# Patient Record
Sex: Female | Born: 1955 | Race: Black or African American | Hispanic: No | Marital: Single | Smoking: Current some day smoker
Health system: Southern US, Community
[De-identification: ages and names within clinical notes are randomized; demographics above are authoritative.]

## PROBLEM LIST (undated history)

## (undated) DIAGNOSIS — M1990 Unspecified osteoarthritis, unspecified site: Secondary | ICD-10-CM

## (undated) DIAGNOSIS — M109 Gout, unspecified: Secondary | ICD-10-CM

## (undated) DIAGNOSIS — E119 Type 2 diabetes mellitus without complications: Secondary | ICD-10-CM

---

## 2015-06-17 ENCOUNTER — Emergency Department: Payer: Medicaid Other

## 2015-06-17 ENCOUNTER — Emergency Department
Admission: EM | Admit: 2015-06-17 | Discharge: 2015-06-17 | Disposition: A | Discharge status: Home / Self Care | Payer: Medicaid Other | Attending: Emergency Medicine | Admitting: Emergency Medicine

## 2015-06-17 ENCOUNTER — Encounter: Payer: Self-pay | Admitting: Emergency Medicine

## 2015-06-17 DIAGNOSIS — Y9389 Activity, other specified: Secondary | ICD-10-CM | POA: Diagnosis not present

## 2015-06-17 DIAGNOSIS — S8001XA Contusion of right knee, initial encounter: Secondary | ICD-10-CM | POA: Diagnosis not present

## 2015-06-17 DIAGNOSIS — Y92003 Bedroom of unspecified non-institutional (private) residence as the place of occurrence of the external cause: Secondary | ICD-10-CM | POA: Insufficient documentation

## 2015-06-17 DIAGNOSIS — W06XXXA Fall from bed, initial encounter: Secondary | ICD-10-CM | POA: Diagnosis not present

## 2015-06-17 DIAGNOSIS — Z79899 Other long term (current) drug therapy: Secondary | ICD-10-CM | POA: Diagnosis not present

## 2015-06-17 DIAGNOSIS — Z720 Tobacco use: Secondary | ICD-10-CM | POA: Insufficient documentation

## 2015-06-17 DIAGNOSIS — E119 Type 2 diabetes mellitus without complications: Secondary | ICD-10-CM | POA: Diagnosis not present

## 2015-06-17 DIAGNOSIS — Y998 Other external cause status: Secondary | ICD-10-CM | POA: Insufficient documentation

## 2015-06-17 DIAGNOSIS — S8991XA Unspecified injury of right lower leg, initial encounter: Secondary | ICD-10-CM | POA: Diagnosis present

## 2015-06-17 HISTORY — DX: Unspecified osteoarthritis, unspecified site: M19.90

## 2015-06-17 HISTORY — DX: Type 2 diabetes mellitus without complications: E11.9

## 2015-06-17 HISTORY — DX: Gout, unspecified: M10.9

## 2015-06-17 MED ORDER — HYDROCODONE-ACETAMINOPHEN 5-325 MG PO TABS: 1.0000 | ORAL_TABLET | ORAL | Status: DC | PRN

## 2015-06-17 MED ORDER — METHOCARBAMOL 500 MG PO TABS: 500.0000 mg | ORAL_TABLET | Freq: Four times a day (QID) | ORAL | Status: DC | PRN

## 2015-06-17 MED ORDER — KETOROLAC TROMETHAMINE 60 MG/2ML IM SOLN
60.0000 mg | Freq: Once | INTRAMUSCULAR | Status: AC
  Administered 2015-06-17: 60 mg via INTRAMUSCULAR
  Filled 2015-06-17: qty 2

## 2015-06-17 MED ORDER — IBUPROFEN 800 MG PO TABS: 800.0000 mg | ORAL_TABLET | Freq: Three times a day (TID) | ORAL | Status: DC | PRN

## 2015-06-17 NOTE — ED Notes (Signed)
Upon attempting to D/C pt, pt refused knee immobilizer. Pt ambulatory around flex at this time with no difficulty.

## 2015-06-17 NOTE — ED Notes (Signed)
Pt fell getting out of bed last night. Here for right knee pain.  Reports hx of arthritis and gout.

## 2015-06-17 NOTE — ED Provider Notes (Signed)
[facility] Emergency Department Provider Note  ____________________________________________  Time seen: Approximately 11:01 AM  I have reviewed the triage vital signs and the nursing notes.   HISTORY  Chief Complaint Knee Pain and Fall    HPI Whitney Gallagher is a 59 y.o. female patient complains of right pain pain since last night. Patient states she fell out of bed last night landing on her right knee.   Past Medical History  Diagnosis Date  . Arthritis   . Diabetes mellitus without complication   . Gout     There are no active problems to display for this patient.   No past surgical history on file.  Current Outpatient Rx  Name  Route  Sig  Dispense  Refill  . METFORMIN HCL PO   Oral   Take by mouth.         Marland Kitchen HYDROcodone-acetaminophen (NORCO) 5-325 MG per tablet   Oral   Take 1-2 tablets by mouth every 4 (four) hours as needed for moderate pain.   15 tablet   0   . ibuprofen (ADVIL,MOTRIN) 800 MG tablet   Oral   Take 1 tablet (800 mg total) by mouth every 8 (eight) hours as needed.   30 tablet   0   . methocarbamol (ROBAXIN) 500 MG tablet   Oral   Take 1 tablet (500 mg total) by mouth every 6 (six) hours as needed for muscle spasms.   30 tablet   0     Allergies Review of patient's allergies indicates no known allergies.  No family history on file.  Social History Social History  Substance Use Topics  . Smoking status: Current Some Day Smoker  . Smokeless tobacco: None  . Alcohol Use: None    Review of Systems Constitutional: No fever/chills Eyes: No visual changes. ENT: No sore throat. Cardiovascular: Denies chest pain. Respiratory: Denies shortness of breath. Gastrointestinal: No abdominal pain.  No nausea, no vomiting.  No diarrhea.  No constipation. Genitourinary: Negative for dysuria. Musculoskeletal: Positive for right knee pain. Skin: Negative for rash. Neurological: Negative for headaches, focal  weakness or numbness.  10-point ROS otherwise negative.  ____________________________________________   PHYSICAL EXAM:  VITAL SIGNS: ED Triage Vitals  Enc Vitals Group     BP 06/17/15 1045 149/63 mmHg     Pulse Rate 06/17/15 1045 85     Resp 06/17/15 1045 20     Temp 06/17/15 1045 98.5 F (36.9 C)     Temp Source 06/17/15 1045 Oral     SpO2 06/17/15 1045 96 %     Weight 06/17/15 1045 175 lb (79.379 kg)     Height 06/17/15 1045 5\' 2"  (1.575 m)     Head Cir --      Peak Flow --      Pain Score 06/17/15 1047 10     Pain Loc --      Pain Edu? --      Excl. in GC? --     Constitutional: Alert and oriented. Well appearing and in no acute distress. Eyes: Conjunctivae are normal. PERRL. EOMI. Head: Atraumatic. Nose: No congestion/rhinnorhea. Mouth/Throat: Mucous membranes are moist.  Oropharynx non-erythematous. Neck: No stridor.   Cardiovascular: Normal rate, regular rhythm. Grossly normal heart sounds.  Good peripheral circulation. Respiratory: Normal respiratory effort.  No retractions. Lungs CTAB. Gastrointestinal: Soft and nontender. No distention. No abdominal bruits. No CVA tenderness. Musculoskeletal: No lower extremity tenderness nor edema.  No joint effusions. Positive range of motion.  No ecchymosis or bruising noted. Increased pain with flexion Neurologic:  Normal speech and language. No gross focal neurologic deficits are appreciated. No gait instability. Skin:  Skin is warm, dry and intact. No rash noted. Psychiatric: Mood and affect are normal. Speech and behavior are normal.  ____________________________________________   LABS (all labs ordered are listed, but only abnormal results are displayed)  Labs Reviewed - No data to display ____________________________________________  RADIOLOGY  Negative for fracture or effusion. ____________________________________________   PROCEDURES  Procedure(s) performed: None  Critical Care performed:  No  ____________________________________________   INITIAL IMPRESSION / ASSESSMENT AND PLAN / ED COURSE  Pertinent labs & imaging results that were available during my care of the patient were reviewed by me and considered in my medical decision making (see chart for details).  Acute right knee contusion. Rx given for Motrin 800 mg 3 times a day, Robaxin 500 mg 3 times a day. And hydrocodone. Patient follow-up with PCP or return to the ER with any worsening symptomology.  Patient voices no other emergency medical complaints at this time. ____________________________________________   FINAL CLINICAL IMPRESSION(S) / ED DIAGNOSES  Final diagnoses:  Knee contusion, right, initial encounter      Evangeline Dakin, PA-C 06/17/15 1214  Jennye Moccasin, MD 06/17/15 [phone]

## 2015-06-17 NOTE — Discharge Instructions (Signed)
Contusion °A contusion is a deep bruise. Contusions are the result of an injury that caused bleeding under the skin. The contusion may turn blue, purple, or yellow. Minor injuries will give you a painless contusion, but more severe contusions may stay painful and swollen for a few weeks.  °CAUSES  °A contusion is usually caused by a blow, trauma, or direct force to an area of the body. °SYMPTOMS  °· Swelling and redness of the injured area. °· Bruising of the injured area. °· Tenderness and soreness of the injured area. °· Pain. °DIAGNOSIS  °The diagnosis can be made by taking a history and physical exam. An X-ray, CT scan, or MRI may be needed to determine if there were any associated injuries, such as fractures. °TREATMENT  °Specific treatment will depend on what area of the body was injured. In general, the best treatment for a contusion is resting, icing, elevating, and applying cold compresses to the injured area. Over-the-counter medicines may also be recommended for pain control. Ask your caregiver what the best treatment is for your contusion. °HOME CARE INSTRUCTIONS  °· Put ice on the injured area. °¨ Put ice in a plastic bag. °¨ Place a towel between your skin and the bag. °¨ Leave the ice on for 15-20 minutes, 3-4 times a day, or as directed by your health care provider. °· Only take over-the-counter or prescription medicines for pain, discomfort, or fever as directed by your caregiver. Your caregiver may recommend avoiding anti-inflammatory medicines (aspirin, ibuprofen, and naproxen) for 48 hours because these medicines may increase bruising. °· Rest the injured area. °· If possible, elevate the injured area to reduce swelling. °SEEK IMMEDIATE MEDICAL CARE IF:  °· You have increased bruising or swelling. °· You have pain that is getting worse. °· Your swelling or pain is not relieved with medicines. °MAKE SURE YOU:  °· Understand these instructions. °· Will watch your condition. °· Will get help right  away if you are not doing well or get worse. °Document Released: 07/06/2005 Document Revised: 10/01/2013 Document Reviewed: 08/01/2011 °Exit[employer]® Patient Information ©2015 [employer]. This information is not intended to replace advice given to you by your health care provider. Make sure you discuss any questions you have with your health care provider. ° °

## 2015-06-17 NOTE — ED Notes (Signed)
NAD noted at time of D/C. Pt ambulatory to the lobby at this time. Pt denies comments/concerns. Extensively explained medications to patient, pt denied further questions at this time. P

## 2016-01-24 ENCOUNTER — Emergency Department: Payer: Medicaid Other

## 2016-01-24 ENCOUNTER — Encounter: Payer: Self-pay | Admitting: Emergency Medicine

## 2016-01-24 ENCOUNTER — Emergency Department
Admission: EM | Admit: 2016-01-24 | Discharge: 2016-01-24 | Disposition: A | Discharge status: Home / Self Care | Payer: Medicaid Other | Attending: Emergency Medicine | Admitting: Emergency Medicine

## 2016-01-24 DIAGNOSIS — F17200 Nicotine dependence, unspecified, uncomplicated: Secondary | ICD-10-CM | POA: Diagnosis not present

## 2016-01-24 DIAGNOSIS — Y929 Unspecified place or not applicable: Secondary | ICD-10-CM | POA: Diagnosis not present

## 2016-01-24 DIAGNOSIS — Z79899 Other long term (current) drug therapy: Secondary | ICD-10-CM | POA: Insufficient documentation

## 2016-01-24 DIAGNOSIS — Z791 Long term (current) use of non-steroidal anti-inflammatories (NSAID): Secondary | ICD-10-CM | POA: Insufficient documentation

## 2016-01-24 DIAGNOSIS — S8391XA Sprain of unspecified site of right knee, initial encounter: Secondary | ICD-10-CM | POA: Insufficient documentation

## 2016-01-24 DIAGNOSIS — W109XXA Fall (on) (from) unspecified stairs and steps, initial encounter: Secondary | ICD-10-CM | POA: Diagnosis not present

## 2016-01-24 DIAGNOSIS — M1711 Unilateral primary osteoarthritis, right knee: Secondary | ICD-10-CM | POA: Diagnosis not present

## 2016-01-24 DIAGNOSIS — Y939 Activity, unspecified: Secondary | ICD-10-CM | POA: Diagnosis not present

## 2016-01-24 DIAGNOSIS — M25561 Pain in right knee: Secondary | ICD-10-CM | POA: Diagnosis present

## 2016-01-24 DIAGNOSIS — E119 Type 2 diabetes mellitus without complications: Secondary | ICD-10-CM | POA: Diagnosis not present

## 2016-01-24 DIAGNOSIS — Z7984 Long term (current) use of oral hypoglycemic drugs: Secondary | ICD-10-CM | POA: Diagnosis not present

## 2016-01-24 DIAGNOSIS — Y999 Unspecified external cause status: Secondary | ICD-10-CM | POA: Diagnosis not present

## 2016-01-24 MED ORDER — IBUPROFEN 800 MG PO TABS
800.0000 mg | ORAL_TABLET | Freq: Once | ORAL | Status: AC
  Administered 2016-01-24: 800 mg via ORAL
  Filled 2016-01-24: qty 1

## 2016-01-24 MED ORDER — OXYCODONE-ACETAMINOPHEN 5-325 MG PO TABS
1.0000 | ORAL_TABLET | Freq: Once | ORAL | Status: AC
  Administered 2016-01-24: 1 via ORAL
  Filled 2016-01-24: qty 1

## 2016-01-24 MED ORDER — OXYCODONE-ACETAMINOPHEN 5-325 MG PO TABS: 1.0000 | ORAL_TABLET | Freq: Four times a day (QID) | ORAL | Status: DC | PRN

## 2016-01-24 MED ORDER — IBUPROFEN 800 MG PO TABS: 800.0000 mg | ORAL_TABLET | Freq: Three times a day (TID) | ORAL | Status: DC | PRN

## 2016-01-24 NOTE — ED Notes (Signed)
States fell last night, slipped off of steps.  Slipped down three steps and landed on right knee. C/O right knee pain and swelling.

## 2016-01-24 NOTE — ED Provider Notes (Signed)
[facility] Emergency Department Provider Note  ____________________________________________  Time seen: Approximately 10:15 AM  I have reviewed the triage vital signs and the nursing notes.   HISTORY  Chief Complaint Knee Injury    HPI Whitney Gallagher is a 60 y.o. female with history of diabetes, gout and arthritis of the knees who fell onto the right knee last night. She has been able to walk, but has significant pain. Has difficulty bending the knee. She is able to fully extend the knee. She hasn't some swelling.She denies any foot or ankle pain. No hip pain.   Past Medical History  Diagnosis Date  . Arthritis   . Diabetes mellitus without complication (HCC)   . Gout     There are no active problems to display for this patient.   History reviewed. No pertinent past surgical history.  Current Outpatient Rx  Name  Route  Sig  Dispense  Refill  . HYDROcodone-acetaminophen (NORCO) 5-325 MG per tablet   Oral   Take 1-2 tablets by mouth every 4 (four) hours as needed for moderate pain.   15 tablet   0   . ibuprofen (ADVIL,MOTRIN) 800 MG tablet   Oral   Take 1 tablet (800 mg total) by mouth every 8 (eight) hours as needed.   30 tablet   0   . ibuprofen (ADVIL,MOTRIN) 800 MG tablet   Oral   Take 1 tablet (800 mg total) by mouth every 8 (eight) hours as needed.   15 tablet   0   . METFORMIN HCL PO   Oral   Take by mouth.         . methocarbamol (ROBAXIN) 500 MG tablet   Oral   Take 1 tablet (500 mg total) by mouth every 6 (six) hours as needed for muscle spasms.   30 tablet   0   . oxyCODONE-acetaminophen (ROXICET) 5-325 MG tablet   Oral   Take 1 tablet by mouth every 6 (six) hours as needed.   20 tablet   0     Allergies Review of patient's allergies indicates no known allergies.  No family history on file.  Social History Social History  Substance Use Topics  . Smoking status: Current Some Day Smoker  . Smokeless  tobacco: Never Used  . 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 Respiratory: Denies shortness of breath. Gastrointestinal: No abdominal pain.  No nausea, no vomiting.   Musculoskeletal: Per history of present illness. Skin: Negative for rash. Neurological: Negative for  focal weakness or numbness. 10-point ROS otherwise negative.  ____________________________________________   PHYSICAL EXAM:  VITAL SIGNS: ED Triage Vitals  Enc Vitals Group     BP 01/24/16 0935 126/69 mmHg     Pulse Rate 01/24/16 0935 113     Resp 01/24/16 0935 18     Temp 01/24/16 0935 97.5 F (36.4 C)     Temp Source 01/24/16 0935 Oral     SpO2 01/24/16 0935 96 %     Weight 01/24/16 0935 238 lb (107.956 kg)     Height 01/24/16 0935  (1.575 m)     Head Cir --      Peak Flow --      Pain Score 01/24/16 0934 10     Pain Loc --      Pain Edu? --  Excl. in GC? --     Constitutional: Alert and oriented. Well appearing and in no acute distress. Eyes: Conjunctivae are normal. Mouth/Throat: Mucous membranes are moist.   Neck:  Supple.    Cardiovascular: Normal rate, regular rhythm. Grossly normal heart sounds.  Good peripheral circulation. Respiratory: Normal respiratory effort.  No retractions. Lungs CTAB. Musculoskeletal: Right knee. Generalized tenderness, over patella and joint lines. Also tender in the answering bursa. Possible small effusion noted. Neurologic:  Normal speech and language. No gross focal neurologic deficits are appreciated. No gait instability. Skin:  Skin is warm, dry and intact. No rash noted. Psychiatric: Mood and affect are normal. Speech and behavior are normal.  ____________________________________________   LABS (all labs ordered are listed, but only abnormal results are displayed)  Labs Reviewed - No data to  display ____________________________________________  EKG   ____________________________________________  RADIOLOGY  CLINICAL DATA: 60 year old female with pain in the right knee during weight-bearing since a fall yesterday evening.  EXAM: RIGHT KNEE - COMPLETE 4+ VIEW  COMPARISON: Right knee radiograph 06/17/2015.  FINDINGS: No acute fracture, subluxation or dislocation noted. Joint space narrowing, subchondral sclerosis, subchondral cyst formation and osteophyte formation in a tricompartmental distribution, most severe in the medial compartment. Multiple radiopaque foreign bodies again noted in the soft tissues of the medial right thigh.  IMPRESSION: 1. No acute radiographic abnormality of the right knee. 2. Moderate to severe tricompartmental osteoarthritis, most severe in the medial compartment.   Electronically Signed  By: Trudie Reedaniel Entrikin M.D.  On: 01/24/2016 10:26  ____________________________________________   PROCEDURES  Procedure(s) performed: None  Critical Care performed: No  ____________________________________________   INITIAL IMPRESSION / ASSESSMENT AND PLAN / ED COURSE  Pertinent labs & imaging results that were available during my care of the patient were reviewed by me and considered in my medical decision making (see chart for details).  60 year old who fell onto her right knee last night. Stable x-rays though has arthritis changes. Suspect a strain/sprain with contusion. Encouraged ice, ibuprofen. Given Ace wrap and crutches. [place] and will follow up with primary care or orthopedist next week. ____________________________________________   FINAL CLINICAL IMPRESSION(S) / ED DIAGNOSES  Final diagnoses:  Knee sprain and strain, right, initial encounter      Ignacia BayleyRobert Celestine Bougie, PA-C 01/24/16 1043  Jene Everyobert Kinner, MD 01/24/16 1549

## 2016-01-24 NOTE — Discharge Instructions (Signed)
Knee Sprain A knee sprain is a tear in one of the strong, fibrous tissues that connect the bones (ligaments) in your knee. The severity of the sprain depends on how much of the ligament is torn. The tear can be either partial or complete. CAUSES  Often, sprains are a result of a fall or injury. The force of the impact causes the fibers of your ligament to stretch too much. This excess tension causes the fibers of your ligament to tear. SIGNS AND SYMPTOMS  You may have some loss of motion in your knee. Other symptoms include:  Bruising.  Pain in the knee area.  Tenderness of the knee to the touch.  Swelling. DIAGNOSIS  To diagnose a knee sprain, your health care provider will physically examine your knee. Your health care provider may also suggest an X-ray exam of your knee to make sure no bones are broken. TREATMENT  If your ligament is only partially torn, treatment usually involves keeping the knee in a fixed position (immobilization) or bracing your knee for activities that require movement for several weeks. To do this, your health care provider will apply a bandage, cast, or splint to keep your knee from moving and to support your knee during movement until it heals. For a partially torn ligament, the healing process usually takes 4-6 weeks. If your ligament is completely torn, depending on which ligament it is, you may need surgery to reconnect the ligament to the bone or reconstruct it. After surgery, a cast or splint may be applied and will need to stay on your knee for 4-6 weeks while your ligament heals. HOME CARE INSTRUCTIONS  Keep your injured knee elevated to decrease swelling.  To ease pain and swelling, apply ice to the injured area:  Put ice in a plastic bag.  Place a towel between your skin and the bag.  Leave the ice on for 20 minutes, 2-3 times a day.  Only take medicine for pain as directed by your health care provider.  Do not leave your knee unprotected until  pain and stiffness go away (usually 4-6 weeks).  If you have a cast or splint, do not allow it to get wet. If you have been instructed not to remove it, cover it with a plastic bag when you shower or bathe. Do not swim.  Your health care provider may suggest exercises for you to do during your recovery to prevent or limit permanent weakness and stiffness. SEEK IMMEDIATE MEDICAL CARE IF:  Your cast or splint becomes damaged.  Your pain becomes worse.  You have significant pain, swelling, or numbness below the cast or splint. MAKE SURE YOU:  Understand these instructions.  Will watch your condition.  Will get help right away if you are not doing well or get worse.   This information is not intended to replace advice given to you by your health care provider. Make sure you discuss any questions you have with your health care provider.   Document Released: 09/26/2005 Document Revised: 10/17/2014 Document Reviewed: 05/08/2013 Elsevier Interactive Patient Education 2016 [employer]   Take pain medicine as directed. Continue ice. Follow-up with the orthopedist next week if not improving.

## 2016-04-17 ENCOUNTER — Emergency Department
Admission: EM | Admit: 2016-04-17 | Discharge: 2016-04-17 | Disposition: A | Discharge status: Home / Self Care | Payer: Medicaid Other | Attending: Emergency Medicine | Admitting: Emergency Medicine

## 2016-04-17 ENCOUNTER — Encounter: Payer: Self-pay | Admitting: Emergency Medicine

## 2016-04-17 ENCOUNTER — Emergency Department: Payer: Medicaid Other

## 2016-04-17 DIAGNOSIS — W010XXA Fall on same level from slipping, tripping and stumbling without subsequent striking against object, initial encounter: Secondary | ICD-10-CM | POA: Diagnosis not present

## 2016-04-17 DIAGNOSIS — F17210 Nicotine dependence, cigarettes, uncomplicated: Secondary | ICD-10-CM | POA: Insufficient documentation

## 2016-04-17 DIAGNOSIS — Z7984 Long term (current) use of oral hypoglycemic drugs: Secondary | ICD-10-CM | POA: Insufficient documentation

## 2016-04-17 DIAGNOSIS — S8001XA Contusion of right knee, initial encounter: Secondary | ICD-10-CM | POA: Diagnosis not present

## 2016-04-17 DIAGNOSIS — M1990 Unspecified osteoarthritis, unspecified site: Secondary | ICD-10-CM | POA: Insufficient documentation

## 2016-04-17 DIAGNOSIS — E119 Type 2 diabetes mellitus without complications: Secondary | ICD-10-CM | POA: Diagnosis not present

## 2016-04-17 DIAGNOSIS — Y999 Unspecified external cause status: Secondary | ICD-10-CM | POA: Insufficient documentation

## 2016-04-17 DIAGNOSIS — Y929 Unspecified place or not applicable: Secondary | ICD-10-CM | POA: Insufficient documentation

## 2016-04-17 DIAGNOSIS — W19XXXA Unspecified fall, initial encounter: Secondary | ICD-10-CM

## 2016-04-17 DIAGNOSIS — M25561 Pain in right knee: Secondary | ICD-10-CM | POA: Diagnosis present

## 2016-04-17 DIAGNOSIS — Y939 Activity, unspecified: Secondary | ICD-10-CM | POA: Insufficient documentation

## 2016-04-17 MED ORDER — HYDROCODONE-ACETAMINOPHEN 5-325 MG PO TABS
1.0000 | ORAL_TABLET | Freq: Once | ORAL | Status: AC
  Administered 2016-04-17: 1 via ORAL
  Filled 2016-04-17: qty 1

## 2016-04-17 MED ORDER — NAPROXEN 500 MG PO TABS: 500.0000 mg | ORAL_TABLET | Freq: Two times a day (BID) | ORAL | Status: AC

## 2016-04-17 NOTE — ED Notes (Signed)
Pt says she was wearing a long dress and tripped about 1 hour ago; fell onto her right knee, landing on concrete; unable to ambulate due to pain;

## 2016-04-17 NOTE — Discharge Instructions (Signed)
Contusion °A contusion is a deep bruise. Contusions happen when an injury causes bleeding under the skin. Symptoms of bruising include pain, swelling, and discolored skin. The skin may turn blue, purple, or yellow. °HOME CARE  °· Rest the injured area. °· If told, put ice on the injured area. °· Put ice in a plastic bag. °· Place a towel between your skin and the bag. °· Leave the ice on for 20 minutes, 2-3 times per day. °· If told, put light pressure (compression) on the injured area using an elastic bandage. Make sure the bandage is not too tight. Remove it and put it back on as told by your doctor. °· If possible, raise (elevate) the injured area above the level of your heart while you are sitting or lying down. °· Take over-the-counter and prescription medicines only as told by your doctor. °GET HELP IF: °· Your symptoms do not get better after several days of treatment. °· Your symptoms get worse. °· You have trouble moving the injured area. °GET HELP RIGHT AWAY IF:  °· You have very bad pain. °· You have a loss of feeling (numbness) in a hand or foot. °· Your hand or foot turns pale or cold. °  °This information is not intended to replace advice given to you by your health care provider. Make sure you discuss any questions you have with your health care provider. °  °Document Released: 03/14/2008 Document Revised: 06/17/2015 Document Reviewed: 02/11/2015 °Elsevier Interactive Patient Education ©2016 [employer] ° °Cryotherapy °Cryotherapy is when you put ice on your injury. Ice helps lessen pain and puffiness (swelling) after an injury. Ice works the best when you start using it in the first 24 to 48 hours after an injury. °HOME CARE °· Put a dry or damp towel between the ice pack and your skin. °· You may press gently on the ice pack. °· Leave the ice on for no more than 10 to 20 minutes at a time. °· Check your skin after 5 minutes to make sure your skin is okay. °· Rest at least 20 minutes between ice  pack uses. °· Stop using ice when your skin loses feeling (numbness). °· Do not use ice on someone who cannot tell you when it hurts. This includes small children and people with memory problems (dementia). °GET HELP RIGHT AWAY IF: °· You have white spots on your skin. °· Your skin turns blue or pale. °· Your skin feels waxy or hard. °· Your puffiness gets worse. °MAKE SURE YOU:  °· Understand these instructions. °· Will watch your condition. °· Will get help right away if you are not doing well or get worse. °  °This information is not intended to replace advice given to you by your health care provider. Make sure you discuss any questions you have with your health care provider. °  °Document Released: 03/14/2008 Document Revised: 12/19/2011 Document Reviewed: 05/19/2011 °Elsevier Interactive Patient Education ©2016 [employer] ° °

## 2016-04-17 NOTE — ED Notes (Signed)
Pt taken to waiting room to wait for son

## 2016-04-17 NOTE — ED Provider Notes (Signed)
[facility] Emergency Department Provider Note  ____________________________________________  Time seen: Approximately 9:02 PM  I have reviewed the triage vital signs and the nursing notes.   HISTORY  Chief Complaint Fall    HPI Whitney BridegroomConstance Gallagher is a 60 y.o. female , NAD, presents to the emergency department with one hour history of fall. Patient states she was wearing a long dress and tripped on the end of it and fell onto her right knee on a concrete area. Has had pain in difficulty weightbearing on the right leg since that time. Also has noted some swelling about the front of her knee. Denies any numbness, weakness, tingling. Did not have any visual changes, chest pain, shortness of breath that caused the fall and states that the fall was mechanical in nature. Has not noted any open wounds or lacerations. States pain is a 10 out of 10.   Past Medical History  Diagnosis Date  . Arthritis   . Diabetes mellitus without complication (HCC)   . Gout     There are no active problems to display for this patient.   History reviewed. No pertinent past surgical history.  Current Outpatient Rx  Name  Route  Sig  Dispense  Refill  . METFORMIN HCL PO   Oral   Take by mouth.         . naproxen (NAPROSYN) 500 MG tablet   Oral   Take 1 tablet (500 mg total) by mouth 2 (two) times daily with a meal.   14 tablet   0     Allergies Review of patient's allergies indicates no known allergies.  History reviewed. No pertinent family history.  Social History Social History  Substance Use Topics  . Smoking status: Current Some Day Smoker    Types: Cigarettes  . Smokeless tobacco: Never Used  . 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  Cardiovascular: No chest pain. Respiratory:  No shortness of breath. Musculoskeletal: Positive right knee pain  Negative for back, neck pain.  Skin: Positive swelling right knee. Negative for rash, redness, open wounds, lacerations, bruising. Neurological: Negative for headaches, focal weakness or numbness. No dizziness, tingling. 10-point ROS otherwise negative.  ____________________________________________   PHYSICAL EXAM:  VITAL SIGNS: ED Triage Vitals  Enc Vitals Group     BP 04/17/16 2037 147/76 mmHg     Pulse Rate 04/17/16 2037 81     Resp 04/17/16 2037 20     Temp 04/17/16 2037 98 F (36.7 C)     Temp Source 04/17/16 2037 Oral     SpO2 04/17/16 2037 98 %     Weight 04/17/16 2037 230 lb (104.327 kg)     Height 04/17/16 2037 5\' 2"  (1.575 m)     Head Cir --      Peak Flow --      Pain Score 04/17/16 2056 10     Pain Loc --      Pain Edu? --      Excl. in GC? --      Constitutional: Alert and oriented. Well appearing and in no acute distress. Eyes: Conjunctivae are normal.  Head: Atraumatic. Neck: Supple with full range of motion Hematological/Lymphatic/Immunilogical: No cervical lymphadenopathy. Cardiovascular: Good peripheral circulation with 2+ pulses noted in the right lower extremity. Respiratory: Normal respiratory effort without tachypnea or retractions.  Musculoskeletal: Significant pain to palpation about  the right, anterior, medial knee with mild swelling. No anterior or posterior drawer laxity. No varus or valgus stress laxity. Full range of motion of the right knee but significant pain when flexing the right knee No lower extremity tenderness nor edema.  No joint effusions. Neurologic:  Normal speech and language. No gross focal neurologic deficits are appreciated.  Skin:  Skin is warm, dry and intact. No rash, redness, bruising, open wounds or lacerations noted. Psychiatric: Mood and affect are normal. Speech and behavior are normal. Patient exhibits appropriate insight and judgement.   ____________________________________________    LABS  None ____________________________________________  EKG  None ____________________________________________  RADIOLOGY I have personally viewed and evaluated these images (plain radiographs) as part of my medical decision making, as well as reviewing the written report by the radiologist.  Dg Knee Complete 4 Views Right  04/17/2016  CLINICAL DATA:  60 year old female with fall and trauma to the right knee. EXAM: RIGHT KNEE - COMPLETE 4+ VIEW COMPARISON:  Radiograph dated 01/24/2016 FINDINGS: There is no acute fracture or dislocation. There is mild osteopenia. There is osteoarthritic changes with tricompartmental narrowing. No joint effusion. Multiple radiopaque densities noted in the soft tissues of the medial aspect of the thigh similar to prior study. IMPRESSION: No acute fracture or dislocation. Electronically Signed   By: Elgie Collard M.D.   On: 04/17/2016 21:47    ____________________________________________    PROCEDURES  Procedure(s) performed: None    Medications  HYDROcodone-acetaminophen (NORCO/VICODIN) 5-325 MG per tablet 1 tablet (1 tablet Oral Given 04/17/16 2111)     ____________________________________________   INITIAL IMPRESSION / ASSESSMENT AND PLAN / ED COURSE  Pertinent imaging results that were available during my care of the patient were reviewed by me and considered in my medical decision making (see chart for details).  Patient's diagnosis is consistent with right knee contusion due to fall. Patient will be discharged home with prescriptions for naproxen to take as directed. Patient was given a walker to assist with ambulation. She is to apply ice to the affected knee 20 minutes 3-4 times daily. Patient is to follow up with her primary care provider or [facility] if symptoms persist past this treatment course. Patient is given ED precautions to return to the ED for any worsening or new symptoms.     ____________________________________________  FINAL CLINICAL IMPRESSION(S) / ED DIAGNOSES  Final diagnoses:  Knee contusion, right, initial encounter  Fall, initial encounter      NEW MEDICATIONS STARTED DURING THIS VISIT:  New Prescriptions   NAPROXEN (NAPROSYN) 500 MG TABLET    Take 1 tablet (500 mg total) by mouth 2 (two) times daily with a meal.         Hope Pigeon, PA-C 04/17/16 2202  Emily Filbert, MD 04/17/16 2312

## 2017-02-23 IMAGING — CR DG KNEE COMPLETE 4+V*R*
4 series · 4 of 4 positions shown · non-contrast
Comparison: Radiograph dated 01/24/2016

CLINICAL DATA: 59-year-old female with fall and trauma to the right
knee.

EXAM:
RIGHT KNEE - COMPLETE 4+ VIEW

[knee ap]
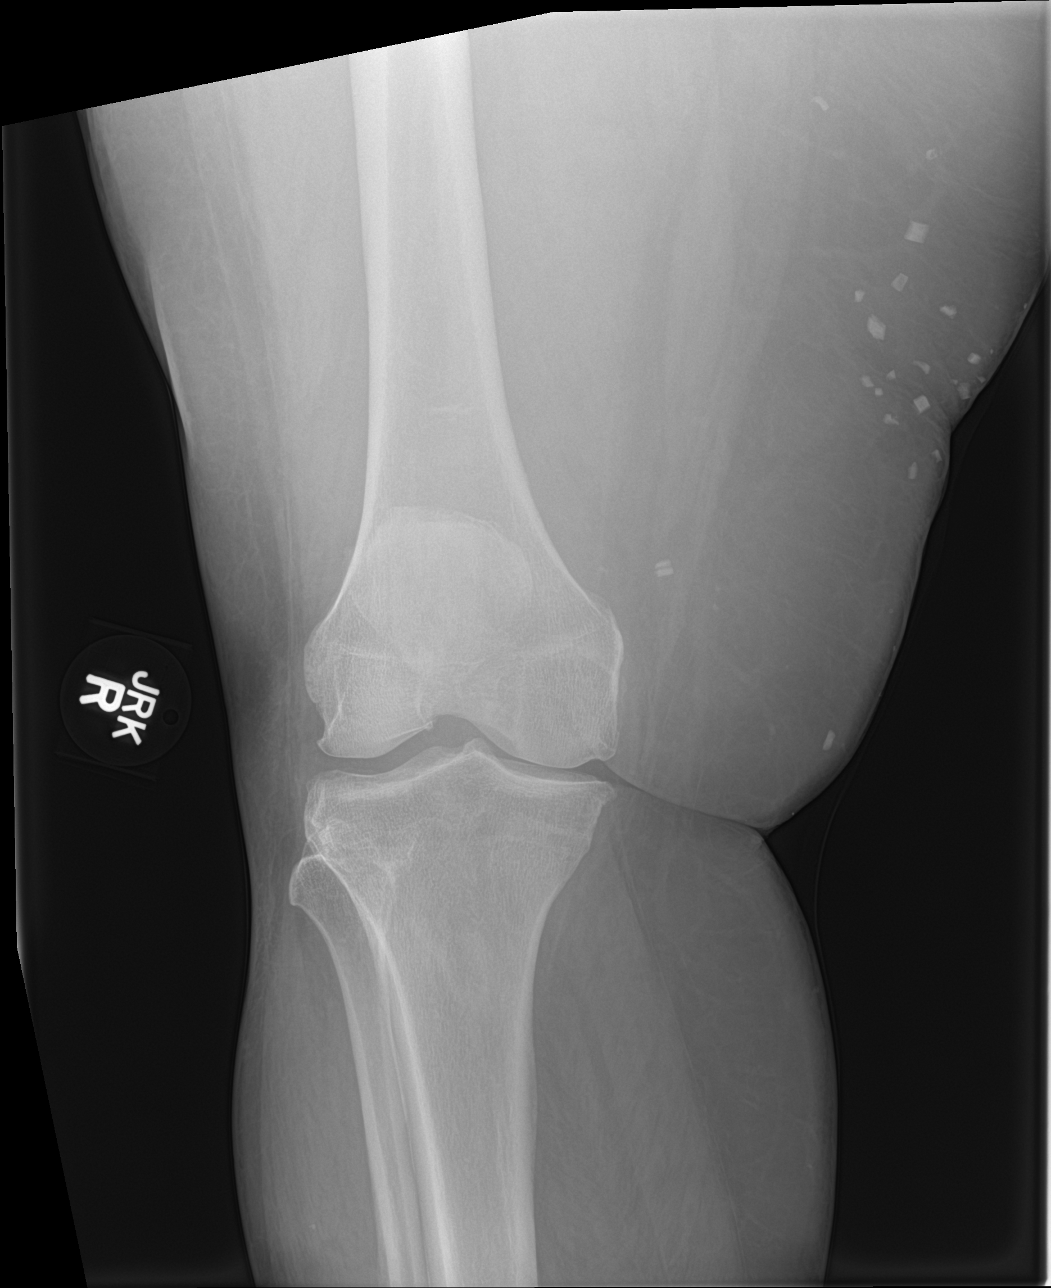

[knee obl (1 of 2)]
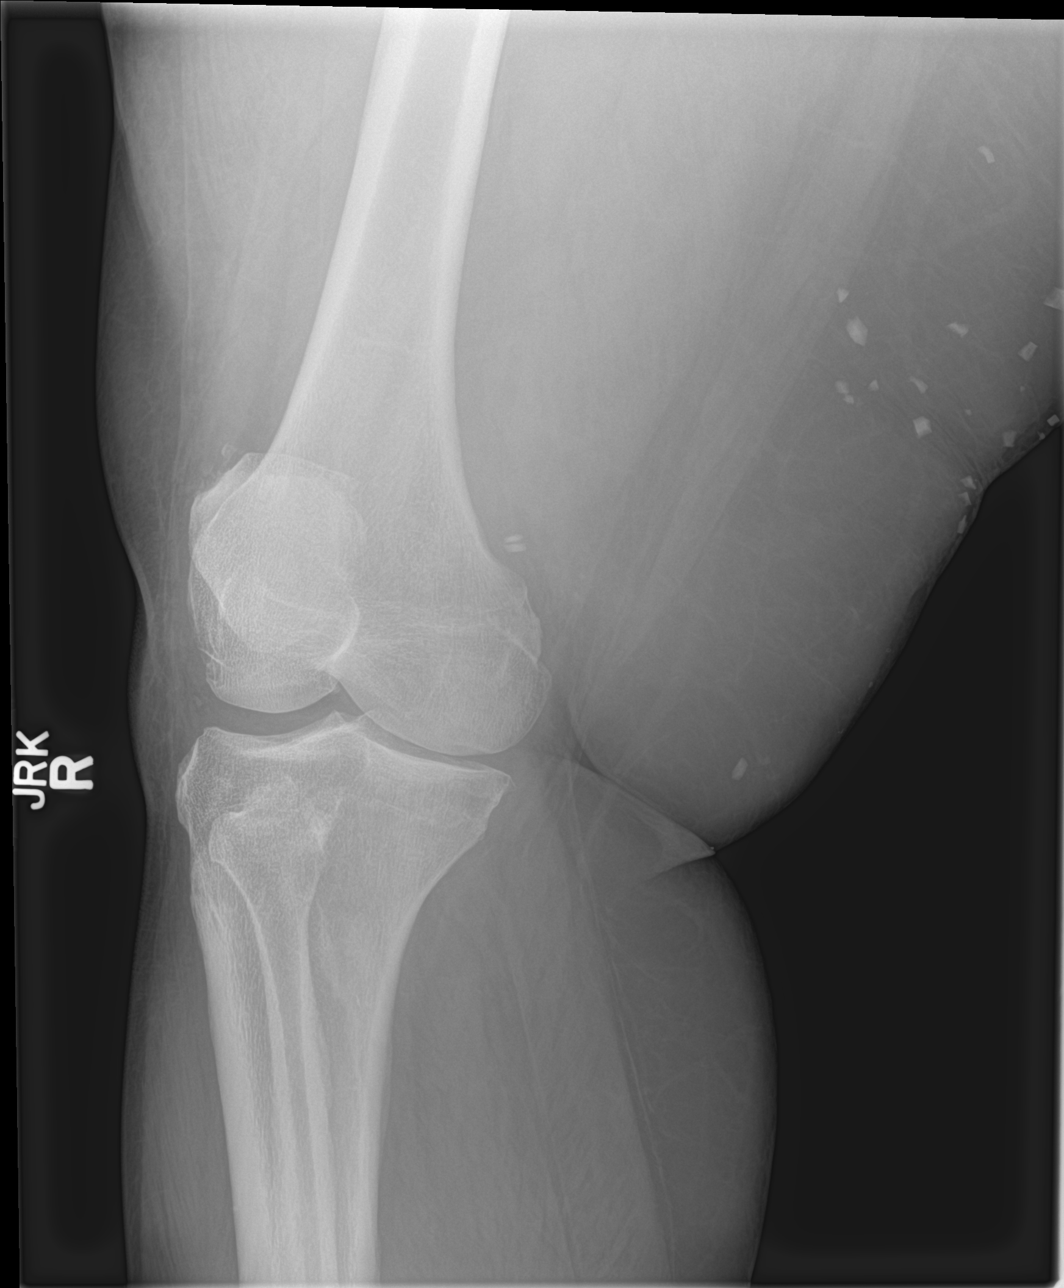

[knee obl (2 of 2)]
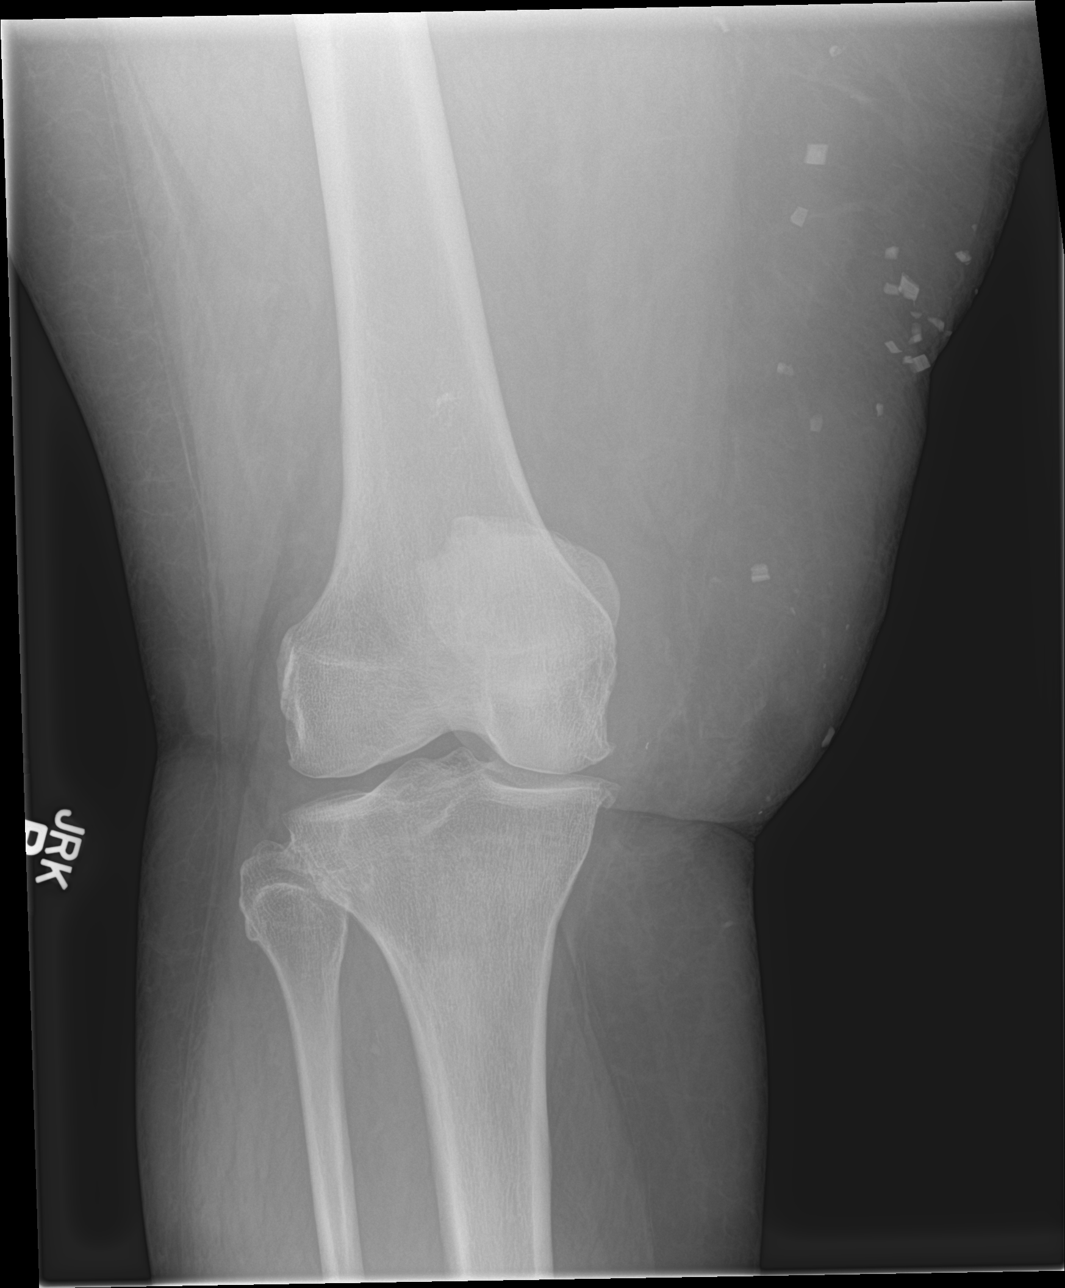

[knee lat]
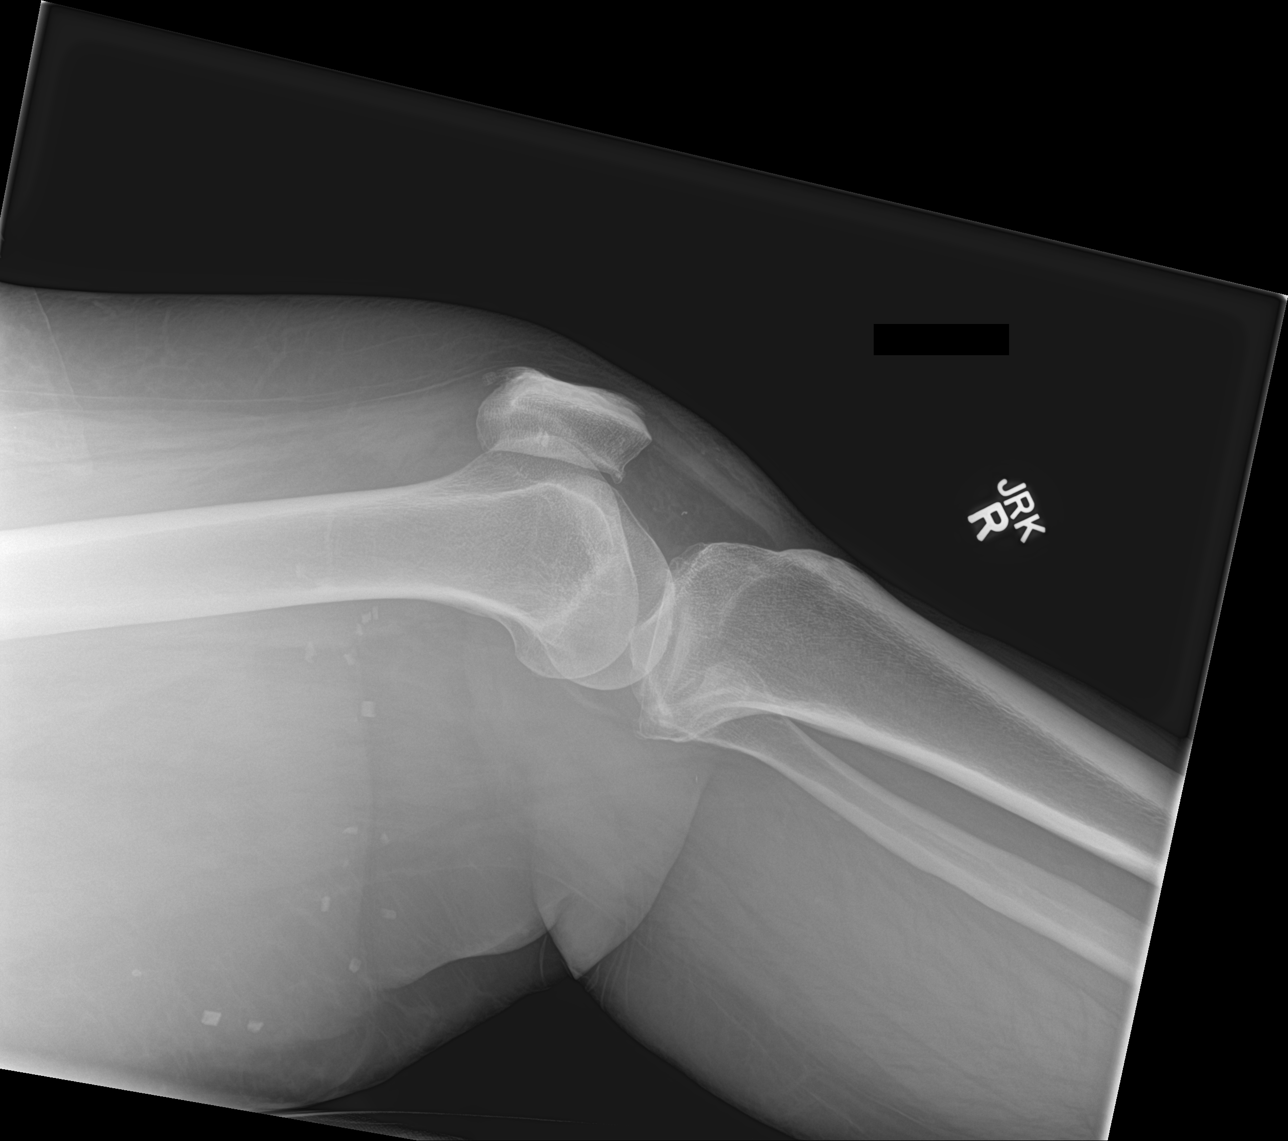

[4 of 4 positions shown; findings below may reference images not displayed]

FINDINGS: There is no acute fracture or dislocation. There is mild osteopenia.
There is osteoarthritic changes with tricompartmental narrowing. No
joint effusion. Multiple radiopaque densities noted in the soft
tissues of the medial aspect of the thigh similar to prior study.
IMPRESSION: No acute fracture or dislocation.
